# Patient Record
Sex: Female | Born: 1960
Health system: Southern US, Community
[De-identification: ages and names within clinical notes are randomized; demographics above are authoritative.]

## PROBLEM LIST (undated history)

## (undated) DIAGNOSIS — Z8639 Personal history of other endocrine, nutritional and metabolic disease: Secondary | ICD-10-CM

## (undated) DIAGNOSIS — Z8619 Personal history of other infectious and parasitic diseases: Secondary | ICD-10-CM

## (undated) DIAGNOSIS — R011 Cardiac murmur, unspecified: Secondary | ICD-10-CM

## (undated) HISTORY — DX: Cardiac murmur, unspecified: R01.1

## (undated) HISTORY — DX: Personal history of other endocrine, nutritional and metabolic disease: Z86.39

## (undated) HISTORY — PX: NASAL SEPTUM SURGERY: SHX37

## (undated) HISTORY — DX: Personal history of other infectious and parasitic diseases: Z86.19

## (undated) HISTORY — PX: OTHER SURGICAL HISTORY: SHX169

---

## 2014-12-13 LAB — HM COLONOSCOPY

## 2015-10-01 LAB — HM MAMMOGRAPHY: HM MAMMO: NORMAL (ref 0–4)

## 2015-10-24 LAB — HM PAP SMEAR

## 2016-09-10 ENCOUNTER — Encounter: Payer: Self-pay | Admitting: Family Medicine

## 2016-09-10 ENCOUNTER — Ambulatory Visit (INDEPENDENT_AMBULATORY_CARE_PROVIDER_SITE_OTHER): Payer: BLUE CROSS/BLUE SHIELD | Admitting: Family Medicine

## 2016-09-10 VITALS — BP 110/80 | HR 57 | Temp 98.0°F | Resp 16 | Ht 63.5 in | Wt 114.5 lb

## 2016-09-10 DIAGNOSIS — M47812 Spondylosis without myelopathy or radiculopathy, cervical region: Secondary | ICD-10-CM

## 2016-09-10 DIAGNOSIS — Z1231 Encounter for screening mammogram for malignant neoplasm of breast: Secondary | ICD-10-CM | POA: Diagnosis not present

## 2016-09-10 DIAGNOSIS — Z23 Encounter for immunization: Secondary | ICD-10-CM

## 2016-09-10 DIAGNOSIS — E538 Deficiency of other specified B group vitamins: Secondary | ICD-10-CM | POA: Diagnosis not present

## 2016-09-10 DIAGNOSIS — Z0000 Encounter for general adult medical examination without abnormal findings: Secondary | ICD-10-CM | POA: Diagnosis not present

## 2016-09-10 LAB — CBC WITH DIFFERENTIAL/PLATELET
BASOS PCT: 1 %
Basophils Absolute: 49 cells/uL (ref 0–200)
EOS PCT: 1 %
Eosinophils Absolute: 49 cells/uL (ref 15–500)
HCT: 38.3 % (ref 35.0–45.0)
Hemoglobin: 13.1 g/dL (ref 11.7–15.5)
LYMPHS PCT: 30 %
Lymphs Abs: 1470 cells/uL (ref 850–3900)
MCH: 32.3 pg (ref 27.0–33.0)
MCHC: 34.2 g/dL (ref 32.0–36.0)
MCV: 94.3 fL (ref 80.0–100.0)
MONOS PCT: 8 %
MPV: 9.8 fL (ref 7.5–12.5)
Monocytes Absolute: 392 cells/uL (ref 200–950)
Neutro Abs: 2940 cells/uL (ref 1500–7800)
Neutrophils Relative %: 60 %
PLATELETS: 288 10*3/uL (ref 140–400)
RBC: 4.06 MIL/uL (ref 3.80–5.10)
RDW: 12.3 % (ref 11.0–15.0)
WBC: 4.9 10*3/uL (ref 3.8–10.8)

## 2016-09-10 LAB — BASIC METABOLIC PANEL
BUN: 18 mg/dL (ref 7–25)
CALCIUM: 9.9 mg/dL (ref 8.6–10.4)
CO2: 28 mmol/L (ref 20–31)
CREATININE: 0.72 mg/dL (ref 0.50–1.05)
Chloride: 102 mmol/L (ref 98–110)
Glucose, Bld: 81 mg/dL (ref 65–99)
Potassium: 4.7 mmol/L (ref 3.5–5.3)
Sodium: 138 mmol/L (ref 135–146)

## 2016-09-10 LAB — LIPID PANEL
CHOLESTEROL: 237 mg/dL — AB (ref 125–200)
HDL: 119 mg/dL (ref >=46)
LDL CALC: 109 mg/dL (ref <130)
TRIGLYCERIDES: 45 mg/dL (ref <150)
Total CHOL/HDL Ratio: 2 Ratio (ref <=5.0)
VLDL: 9 mg/dL (ref <30)

## 2016-09-10 LAB — HEPATIC FUNCTION PANEL
ALBUMIN: 4.5 g/dL (ref 3.6–5.1)
ALT: 14 U/L (ref 6–29)
AST: 18 U/L (ref 10–35)
Alkaline Phosphatase: 46 U/L (ref 33–130)
BILIRUBIN DIRECT: 0.2 mg/dL (ref <=0.2)
Indirect Bilirubin: 1 mg/dL (ref 0.2–1.2)
Total Bilirubin: 1.2 mg/dL (ref 0.2–1.2)
Total Protein: 7.3 g/dL (ref 6.1–8.1)

## 2016-09-10 LAB — TSH: TSH: 1.09 mIU/L

## 2016-09-10 NOTE — Progress Notes (Signed)
Pre visit review using our clinic review tool, if applicable. No additional management support is needed unless otherwise documented below in the visit note. 

## 2016-09-10 NOTE — Progress Notes (Signed)
   Subjective:    Patient ID: Julia Ford, female    DOB: 11/06/1961, 55 y.o.   MRN: [id number]  HPI New to establish.  Recently moved from [place]  CPE- due for mammo in November, UTD on pap until 2019.  Colonoscopy 2015- due 2025.  Hx of B12 deficiency.  Pt exercises excessively- runs marathons and is triathlon training.  Known thyroid nodule- has been bx'd and results were negative.  Pt has known degenerative changes in C spine as seen on MRI- asking for PT referral.   Review of Systems Patient reports no vision/ hearing changes, adenopathy,fever, weight change,  persistant/recurrent hoarseness , swallowing issues, chest pain, palpitations, edema, persistant/recurrent cough, hemoptysis, dyspnea (rest/exertional/paroxysmal nocturnal), gastrointestinal bleeding (melena, rectal bleeding), abdominal pain, significant heartburn, bowel changes, GU symptoms (dysuria, hematuria, incontinence), Gyn symptoms (abnormal  bleeding, pain),  syncope, focal weakness, memory loss, numbness & tingling, skin/hair/nail changes, abnormal bruising or bleeding, anxiety, or depression.     Objective:   Physical Exam General Appearance:    Alert, cooperative, no distress, appears stated age  Head:    Normocephalic, without obvious abnormality, atraumatic  Eyes:    PERRL, conjunctiva/corneas clear, EOM's intact, fundi    benign, both eyes  Ears:    Normal TM's and external ear canals, both ears  Nose:   Nares normal, septum midline, mucosa normal, no drainage    or sinus tenderness  Throat:   Lips, mucosa, and tongue normal; teeth and gums normal  Neck:   Supple, symmetrical, trachea midline, no adenopathy;    Thyroid: no enlargement/tenderness/nodules  Back:     Symmetric, no curvature, ROM normal, no CVA tenderness  Lungs:     Clear to auscultation bilaterally, respirations unlabored  Chest Wall:    No tenderness or deformity   Heart:    Regular rate and rhythm, S1 and S2 normal, no murmur, rub   or gallop    Breast Exam:    Deferred to mammo  Abdomen:     Soft, non-tender, bowel sounds active all four quadrants,    no masses, no organomegaly  Genitalia:    Deferred  Rectal:    Extremities:   Extremities normal, atraumatic, no cyanosis or edema  Pulses:   2+ and symmetric all extremities  Skin:   Skin color, texture, turgor normal, no rashes or lesions  Lymph nodes:   Cervical, supraclavicular, and axillary nodes normal  Neurologic:   CNII-XII intact, normal strength, sensation and reflexes    throughout          Assessment & Plan:  PE- pt's PE WNL.  UTD on pap.  Due for mammo- order entered.  UTD on colonoscopy.  Check labs- including B12 as pt has hx of deficiency.  Check labs.  Anticipatory guidance provided.

## 2016-09-10 NOTE — Patient Instructions (Signed)
Follow up in 1 year or as needed We'll notify you of your lab results and make any changes if needed Keep up the good work on healthy diet and regular exercise- you look great!!! We'll call you with your mammogram appt and PT appts Call with any questions or concerns Welcome!  We're glad to have you!!!

## 2016-09-11 ENCOUNTER — Encounter: Payer: Self-pay | Admitting: Family Medicine

## 2016-09-11 ENCOUNTER — Ambulatory Visit: Payer: Self-pay | Admitting: Family Medicine

## 2016-09-11 LAB — VITAMIN B12: VITAMIN B 12: 328 pg/mL (ref 200–1100)

## 2016-09-11 LAB — VITAMIN D 25 HYDROXY (VIT D DEFICIENCY, FRACTURES): Vit D, 25-Hydroxy: 30 ng/mL (ref 30–100)

## 2016-09-14 ENCOUNTER — Ambulatory Visit (INDEPENDENT_AMBULATORY_CARE_PROVIDER_SITE_OTHER): Payer: BLUE CROSS/BLUE SHIELD | Admitting: Emergency Medicine

## 2016-09-14 DIAGNOSIS — E538 Deficiency of other specified B group vitamins: Secondary | ICD-10-CM | POA: Diagnosis not present

## 2016-09-14 MED ORDER — CYANOCOBALAMIN 1000 MCG/ML IJ SOLN
1000.0000 ug | Freq: Once | INTRAMUSCULAR | Status: AC
  Administered 2016-09-14: 1000 ug via INTRAMUSCULAR

## 2016-09-22 ENCOUNTER — Encounter: Payer: Self-pay | Admitting: General Practice

## 2016-10-07 ENCOUNTER — Encounter: Payer: Self-pay | Admitting: Family Medicine

## 2016-10-26 ENCOUNTER — Ambulatory Visit (INDEPENDENT_AMBULATORY_CARE_PROVIDER_SITE_OTHER): Payer: BLUE CROSS/BLUE SHIELD | Admitting: General Practice

## 2016-10-26 DIAGNOSIS — E538 Deficiency of other specified B group vitamins: Secondary | ICD-10-CM | POA: Diagnosis not present

## 2016-10-26 MED ORDER — CYANOCOBALAMIN 1000 MCG/ML IJ SOLN: 1000.0000 ug | Freq: Once | INTRAMUSCULAR | 0 refills | Status: DC

## 2016-10-26 MED ORDER — CYANOCOBALAMIN 1000 MCG/ML IJ SOLN
1000.0000 ug | Freq: Once | INTRAMUSCULAR | Status: DC
  Administered 2016-10-26: 1000 ug via INTRAMUSCULAR

## 2016-11-11 ENCOUNTER — Telehealth: Payer: Self-pay | Admitting: Family Medicine

## 2016-11-11 ENCOUNTER — Ambulatory Visit (INDEPENDENT_AMBULATORY_CARE_PROVIDER_SITE_OTHER): Payer: BLUE CROSS/BLUE SHIELD | Admitting: Family Medicine

## 2016-11-11 ENCOUNTER — Encounter: Payer: Self-pay | Admitting: Family Medicine

## 2016-11-11 VITALS — BP 106/62 | HR 52 | Temp 98.2°F | Resp 16 | Ht 64.0 in | Wt 117.0 lb

## 2016-11-11 DIAGNOSIS — R1013 Epigastric pain: Secondary | ICD-10-CM | POA: Diagnosis not present

## 2016-11-11 MED ORDER — GI COCKTAIL ~~LOC~~
30.0000 mL | Freq: Once | ORAL | Status: AC
  Administered 2016-11-11: 30 mL via ORAL

## 2016-11-11 MED ORDER — SUCRALFATE 1 G PO TABS: 1.0000 g | ORAL_TABLET | Freq: Three times a day (TID) | ORAL | 0 refills | Status: DC

## 2016-11-11 MED ORDER — OMEPRAZOLE 40 MG PO CPDR: 40.0000 mg | DELAYED_RELEASE_CAPSULE | Freq: Every day | ORAL | 3 refills | Status: DC

## 2016-11-11 NOTE — Patient Instructions (Signed)
Follow up as needed Start the Omeprazole daily x2 weeks to decrease acid production and then as needed Start the Carafate prior to meals and before bed to coat the stomach and prevent inflammation and allow healing (you will probably only need this for a few days but you have plenty!) Call with any questions or concerns Happy Holidays!!!

## 2016-11-11 NOTE — Telephone Encounter (Signed)
Julia Ford is calling pt to see if she can make the 11am appt, if not she can be placed on cody's schedule., per PCP.

## 2016-11-11 NOTE — Telephone Encounter (Signed)
Pt scheduled at 11 am today

## 2016-11-11 NOTE — Telephone Encounter (Signed)
Patient called back after speaking with triage to states she will not go to ED as instructed by nurse.  She states she will only see Dr. Beverely Lowabori.  Patient requesting to be called back in the event pcp has a cancellation today.

## 2016-11-11 NOTE — Telephone Encounter (Signed)
[facility] [place] [facility] - Cli TELEPHONE ADVICE RECORD [facility]  Patient Name: Julia SpittleCHRISTINE Newby  DOB: 05-10-1961    Initial Comment stomach cramps when she eats, pressure in chest, when she takes a deep breath she can feel pain in her stomach, when she lays down she can feel the chest pain more   Nurse Assessment  Nurse: Dorthula RuePatten, RN, Enrique SackKendra Date/Time (Eastern Time): 11/11/2016 10:14:44 AM  Confirm and document reason for call. If symptomatic, describe symptoms. ---Caller states she is having upper abdominal cramping. She states it's not really a "chest pain". She states it's an unusual pain she cannot describe it. She states she could not finish eating due to stomach cramping this morning. She states this started last night.  Does the patient have any new or worsening symptoms? ---Yes  Will a triage be completed? ---Yes  Related visit to physician within the last 2 weeks? ---No  Does the PT have any chronic conditions? (i.e. diabetes, asthma, etc.) ---No  Is the patient pregnant or possibly pregnant? (Ask all females between the ages of 1612-55) ---No  Is this a behavioral health or substance abuse call? ---No     Guidelines    Guideline Title Affirmed Question Affirmed Notes  Abdominal Pain - Upper [1] MILD-MODERATE pain AND [2] constant AND [3] present > 2 hours    Final Disposition User   See Physician within 4 Hours (or PCP triage) Dorthula RuePatten, RN, Kendra    Comments  Caller states she will not go to ED or Urgent Care despite her symptoms. She think it's just a virus. Advised her again of my recommendation. She states she is going to call office to put on cancellation list.   Referrals  GO TO FACILITY REFUSED   Disagree/Comply: Disagree  Disagree/Comply Reason: Disagree with instructions

## 2016-11-11 NOTE — Progress Notes (Signed)
   Subjective:    Patient ID: Julia SpittleChristine Trevino, female    DOB: 06/05/1961, 55 y.o.   MRN: [id number]  HPI abd pain- pt reports 'a really big cramp' in her stomach when she takes a deep breath.  Worse when she ate cereal this AM.  Was able to drink coffee after the cereal w/o difficulty.  Pt reports she will have chest 'tightness, like a cramp' when she lies down.  sxs started after eating dinner and worsened as the evening went on.  Was able to run 6 miles and go to the gym yesterday.  Husband had similar sxs that lasted 3 days last week.  No N/V/D.  No chest TTP   Review of Systems For ROS see HPI     Objective:   Physical Exam  Constitutional: She is oriented to person, place, and time. She appears well-developed and well-nourished. No distress.  HENT:  Head: Normocephalic and atraumatic.  Eyes: Conjunctivae and EOM are normal. Pupils are equal, round, and reactive to light.  Neck: Normal range of motion. Neck supple. No thyromegaly present.  Cardiovascular: Normal rate, regular rhythm, normal heart sounds and intact distal pulses.   No murmur heard. Pulmonary/Chest: Effort normal and breath sounds normal. No respiratory distress.  Abdominal: Soft. She exhibits no distension. There is tenderness (epigastric TTP). There is no rebound and no guarding.  Musculoskeletal: She exhibits no edema.  Lymphadenopathy:    She has no cervical adenopathy.  Neurological: She is alert and oriented to person, place, and time.  Skin: Skin is warm and dry.  Psychiatric: She has a normal mood and affect. Her behavior is normal.  Vitals reviewed.         Assessment & Plan:  Epigastric pain- new.  Pt's sxs and PE consistent w/ GERD/gastritis.  sxs improved s/p GI cocktail.  Start PPI, carafate.  Reviewed dietary and lifestyle modifications.  Will follow.

## 2016-11-11 NOTE — Progress Notes (Signed)
Pre visit review using our clinic review tool, if applicable. No additional management support is needed unless otherwise documented below in the visit note. 

## 2016-11-30 ENCOUNTER — Ambulatory Visit (INDEPENDENT_AMBULATORY_CARE_PROVIDER_SITE_OTHER): Payer: BLUE CROSS/BLUE SHIELD | Admitting: *Deleted

## 2016-11-30 DIAGNOSIS — E538 Deficiency of other specified B group vitamins: Secondary | ICD-10-CM | POA: Diagnosis not present

## 2016-11-30 MED ORDER — CYANOCOBALAMIN 1000 MCG/ML IJ SOLN
1000.0000 ug | Freq: Once | INTRAMUSCULAR | Status: AC
  Administered 2016-11-30: 1000 ug via INTRAMUSCULAR

## 2016-12-03 ENCOUNTER — Ambulatory Visit: Payer: BLUE CROSS/BLUE SHIELD

## 2017-01-01 ENCOUNTER — Ambulatory Visit (INDEPENDENT_AMBULATORY_CARE_PROVIDER_SITE_OTHER): Payer: BLUE CROSS/BLUE SHIELD | Admitting: *Deleted

## 2017-01-01 DIAGNOSIS — E538 Deficiency of other specified B group vitamins: Secondary | ICD-10-CM | POA: Diagnosis not present

## 2017-01-01 MED ORDER — CYANOCOBALAMIN 1000 MCG/ML IJ SOLN
1000.0000 ug | Freq: Once | INTRAMUSCULAR | Status: AC
  Administered 2017-01-01: 1000 ug via INTRAMUSCULAR

## 2017-01-07 ENCOUNTER — Ambulatory Visit: Payer: BLUE CROSS/BLUE SHIELD

## 2017-01-18 ENCOUNTER — Ambulatory Visit
Admission: RE | Admit: 2017-01-18 | Discharge: 2017-01-18 | Disposition: A | Discharge status: Home / Self Care | Payer: BLUE CROSS/BLUE SHIELD | Source: Ambulatory Visit | Attending: Family Medicine | Admitting: Family Medicine

## 2017-01-18 DIAGNOSIS — Z1231 Encounter for screening mammogram for malignant neoplasm of breast: Secondary | ICD-10-CM

## 2017-01-28 ENCOUNTER — Ambulatory Visit: Payer: Self-pay | Admitting: Family Medicine

## 2017-02-05 ENCOUNTER — Ambulatory Visit (INDEPENDENT_AMBULATORY_CARE_PROVIDER_SITE_OTHER): Payer: BLUE CROSS/BLUE SHIELD | Admitting: Physician Assistant

## 2017-02-05 ENCOUNTER — Encounter: Payer: Self-pay | Admitting: Physician Assistant

## 2017-02-05 ENCOUNTER — Other Ambulatory Visit: Payer: BLUE CROSS/BLUE SHIELD

## 2017-02-05 VITALS — BP 108/68 | HR 67 | Temp 97.7°F | Resp 14 | Ht 64.0 in | Wt 118.0 lb

## 2017-02-05 DIAGNOSIS — E538 Deficiency of other specified B group vitamins: Secondary | ICD-10-CM

## 2017-02-05 DIAGNOSIS — H6191 Disorder of right external ear, unspecified: Secondary | ICD-10-CM | POA: Diagnosis not present

## 2017-02-05 MED ORDER — MUPIROCIN CALCIUM 2 % EX CREA: TOPICAL_CREAM | CUTANEOUS | 0 refills | Status: DC

## 2017-02-05 MED ORDER — CYANOCOBALAMIN 1000 MCG/ML IJ SOLN
1000.0000 ug | Freq: Once | INTRAMUSCULAR | Status: AC
  Administered 2017-02-05: 1000 ug via INTRAMUSCULAR

## 2017-02-05 NOTE — Addendum Note (Signed)
Addended by: Con MemosMOORE, Maleigh Bagot S on: 02/05/2017 02:46 PM   Modules accepted: Orders

## 2017-02-05 NOTE — Addendum Note (Signed)
Addended by: Marcelline MatesMARTIN, Quetzal Meany on: 02/05/2017 02:30 PM   Modules accepted: Orders

## 2017-02-05 NOTE — Progress Notes (Signed)
   Pt presents to clinic today c/o right ear pain x 4 days. Pt states that right ear is painful with touch and pain is originating from the external ear. Describes pain as a 2.5/10 severity sharp pain upon palpation. Denies decreased hearing, tinnitus, drainage, dizziness, or ear pressure. Pt is a swimmer, and states she swims approx. 4 times a week. Denies having issues like this before. Denies recent travel. Has not tried anything OTC for pain, denies anything besides touch making it worse. Water from swimming this morning was not irritating.    Past Medical History:  Diagnosis Date  . Heart murmur   . History of chicken pox   . History of non anemic vitamin B12 deficiency     Current Outpatient Prescriptions on File Prior to Visit  Medication Sig Dispense Refill  . Misc Natural Products (OSTEO BI-FLEX JOINT SHIELD PO) Take by mouth.     No current facility-administered medications on file prior to visit.     Allergies  Allergen Reactions  . Codeine Other (See Comments)    Makes pt fall asleep (severe drowsiness), does not like to take    Family History  Problem Relation Age of Onset  . Heart attack Mother   . Hypertension Mother   . Heart attack Father   . Hypertension Father   . Cancer Maternal Grandmother     breast  . Stroke Maternal Grandfather   . Heart attack Paternal Grandfather     Social History   Social History  . Marital status: Married    Spouse name: N/A  . Number of children: N/A  . Years of education: N/A   Social History Main Topics  . Smoking status: Never Smoker  . Smokeless tobacco: Never Used  . Alcohol use Yes  . Drug use: No  . Sexual activity: Not Asked   Other Topics Concern  . None   Social History Narrative  . None   Review of Systems - See HPI.  All other ROS are negative.  BP 108/68   Pulse 67   Temp 97.7 F (36.5 C) (Oral)   Resp 14   Ht 5\' 4"  (1.626 m)   Wt 118 lb (53.5 kg)   SpO2 99%   BMI 20.25 kg/m   Physical  Exam  Constitutional: She is oriented to person, place, and time and well-developed, well-nourished, and in no distress.  HENT:  Head: Normocephalic and atraumatic.  Right Ear: Tympanic membrane normal.  Left Ear: External ear normal.  Ears:  Nose: Nose normal.  Mouth/Throat: Oropharynx is clear and moist. No oropharyngeal exudate.  Eyes: Conjunctivae are normal.  Neck: Neck supple.  Cardiovascular: Normal rate, regular rhythm, normal heart sounds and intact distal pulses.   Pulmonary/Chest: Effort normal and breath sounds normal. No respiratory distress. She has no wheezes. She has no rales. She exhibits no tenderness.  Lymphadenopathy:    She has no cervical adenopathy.  Neurological: She is alert and oriented to person, place, and time.  Skin: Skin is warm and dry. No rash noted.  Psychiatric: Affect normal.  Vitals reviewed.  Assessment/Plan: 1. Lesion of external ear canal, right Supportive measures reviewed. Hygiene discussed. Rx Mupirocin ointment to the area.  Follow-up if not resolving.    Piedad ClimesMartin, Harinder Romas Cody, PA-C

## 2017-02-05 NOTE — Progress Notes (Signed)
Pre visit review using our clinic review tool, if applicable. No additional management support is needed unless otherwise documented below in the visit note. 

## 2017-02-05 NOTE — Patient Instructions (Signed)
Please keep skin clean and dry.  Avoid scratching or picking at the ear so things can heal.  Apply the mupirocin as directed to the area with a q-tip. Do not put down into the ear.   Please let us know if symptoms are not resolving or if any new symptoms develop.

## 2017-04-02 ENCOUNTER — Ambulatory Visit (INDEPENDENT_AMBULATORY_CARE_PROVIDER_SITE_OTHER): Payer: BLUE CROSS/BLUE SHIELD | Admitting: Family Medicine

## 2017-04-02 ENCOUNTER — Encounter: Payer: Self-pay | Admitting: Family Medicine

## 2017-04-02 VITALS — BP 92/70 | HR 63 | Temp 98.1°F | Resp 16 | Ht 64.0 in | Wt 115.5 lb

## 2017-04-02 DIAGNOSIS — M542 Cervicalgia: Secondary | ICD-10-CM

## 2017-04-02 DIAGNOSIS — E538 Deficiency of other specified B group vitamins: Secondary | ICD-10-CM

## 2017-04-02 LAB — VITAMIN B12: Vitamin B-12: >1500 pg/mL — ABNORMAL HIGH (ref 211–911)

## 2017-04-02 MED ORDER — MELOXICAM 15 MG PO TABS: 15.0000 mg | ORAL_TABLET | Freq: Every day | ORAL | 1 refills | Status: DC

## 2017-04-02 MED ORDER — CYCLOBENZAPRINE HCL 5 MG PO TABS: 5.0000 mg | ORAL_TABLET | Freq: Every day | ORAL | 1 refills | Status: DC

## 2017-04-02 MED ORDER — CYANOCOBALAMIN 1000 MCG/ML IJ SOLN
1000.0000 ug | Freq: Once | INTRAMUSCULAR | Status: AC
  Administered 2017-04-02: 1000 ug via INTRAMUSCULAR

## 2017-04-02 NOTE — Patient Instructions (Signed)
Follow as scheduled Use the Flexeril at night to help w/ spasm- will cause drowsiness Take the Meloxicam once daily- w/ food- for inflammation.  Do not take any additional ibuprofen, Motrin, Aleve Heating pad as needed Try and vary your cycling position during your rides Call with any questions or concerns GOOD LUCK!!!

## 2017-04-02 NOTE — Progress Notes (Signed)
   Subjective:    Patient ID: Julia Ford, female    DOB: 1961/05/26, 56 y.o.   MRN: [id number]  HPI Neck pain- pt has hx of degenerative changes to cervical spine.  Pt has recently been riding her bike more w/ her triathlon training and afterwards, reports severe stiffness, decreased ROM.  Pt reports neck pain has improved somewhat w/ handlebar adjustments.  sxs started ~1 month ago.    B12 deficiency- pt is due for injxn and repeat labs.   Review of Systems For ROS see HPI     Objective:   Physical Exam  Constitutional: She is oriented to person, place, and time. She appears well-developed and well-nourished. No distress.  HENT:  Head: Normocephalic and atraumatic.  Musculoskeletal: She exhibits tenderness (bilateral trap TTP and spasm).  Full ROM of both shoulders and neck  Neurological: She is alert and oriented to person, place, and time. She has normal reflexes.  Skin: Skin is warm and dry.  Psychiatric: She has a normal mood and affect. Her behavior is normal. Thought content normal.  Vitals reviewed.         Assessment & Plan:  Neck pain- positional muscle spasm/inflammation from bike riding.  Reviewed dx, supportive care.  Start daily Mobic and Flexeril for pain and spasm.  Reviewed supportive care and red flags that should prompt return.  Pt expressed understanding and is in agreement w/ plan.   B12 deficiency- injxn given today.  Recheck labs.

## 2017-04-02 NOTE — Addendum Note (Signed)
Addended by: Geannie RisenBRODMERKEL, Jeanna Giuffre L on: 04/02/2017 09:13 AM   Modules accepted: Orders

## 2017-04-02 NOTE — Progress Notes (Signed)
Pre visit review using our clinic review tool, if applicable. No additional management support is needed unless otherwise documented below in the visit note. 

## 2017-04-06 ENCOUNTER — Encounter: Payer: Self-pay | Admitting: Family Medicine

## 2017-07-02 ENCOUNTER — Telehealth: Payer: Self-pay | Admitting: Family Medicine

## 2017-07-02 NOTE — Telephone Encounter (Signed)
Just FYI.
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
## 2017-07-02 NOTE — Telephone Encounter (Signed)
Patient is aware and stated verbal understanding 

## 2017-07-02 NOTE — Telephone Encounter (Signed)
Since pt doesn't want to go to ED- if her sxs worsen over the weekend [facility]

## 2017-09-16 ENCOUNTER — Encounter: Payer: Self-pay | Admitting: Family Medicine

## 2017-09-16 ENCOUNTER — Encounter: Payer: Self-pay | Admitting: General Practice

## 2017-09-16 ENCOUNTER — Ambulatory Visit (INDEPENDENT_AMBULATORY_CARE_PROVIDER_SITE_OTHER): Payer: BLUE CROSS/BLUE SHIELD | Admitting: Family Medicine

## 2017-09-16 VITALS — BP 96/73 | HR 63 | Temp 98.3°F | Resp 16 | Ht 64.0 in | Wt 113.1 lb

## 2017-09-16 DIAGNOSIS — Z0000 Encounter for general adult medical examination without abnormal findings: Secondary | ICD-10-CM | POA: Diagnosis not present

## 2017-09-16 DIAGNOSIS — E538 Deficiency of other specified B group vitamins: Secondary | ICD-10-CM

## 2017-09-16 DIAGNOSIS — Z23 Encounter for immunization: Secondary | ICD-10-CM

## 2017-09-16 DIAGNOSIS — E785 Hyperlipidemia, unspecified: Secondary | ICD-10-CM | POA: Diagnosis not present

## 2017-09-16 LAB — BASIC METABOLIC PANEL
BUN: 15 mg/dL (ref 6–23)
CALCIUM: 9.9 mg/dL (ref 8.4–10.5)
CHLORIDE: 103 meq/L (ref 96–112)
CO2: 32 meq/L (ref 19–32)
Creatinine, Ser: 0.69 mg/dL (ref 0.40–1.20)
GFR: 94.26 mL/min (ref >60.00)
Glucose, Bld: 94 mg/dL (ref 70–99)
Potassium: 5.2 mEq/L — ABNORMAL HIGH (ref 3.5–5.1)
SODIUM: 140 meq/L (ref 135–145)

## 2017-09-16 LAB — CBC WITH DIFFERENTIAL/PLATELET
BASOS ABS: 0.1 10*3/uL (ref 0.0–0.1)
BASOS PCT: 1.9 % (ref 0.0–3.0)
EOS ABS: 0.1 10*3/uL (ref 0.0–0.7)
Eosinophils Relative: 2.5 % (ref 0.0–5.0)
HEMATOCRIT: 39 % (ref 36.0–46.0)
HEMOGLOBIN: 13 g/dL (ref 12.0–15.0)
LYMPHS PCT: 36.9 % (ref 12.0–46.0)
Lymphs Abs: 1.2 10*3/uL (ref 0.7–4.0)
MCHC: 33.3 g/dL (ref 30.0–36.0)
MCV: 97.4 fl (ref 78.0–100.0)
Monocytes Absolute: 0.3 10*3/uL (ref 0.1–1.0)
Monocytes Relative: 8.1 % (ref 3.0–12.0)
Neutro Abs: 1.7 10*3/uL (ref 1.4–7.7)
Neutrophils Relative %: 50.6 % (ref 43.0–77.0)
Platelets: 253 10*3/uL (ref 150.0–400.0)
RBC: 4 Mil/uL (ref 3.87–5.11)
RDW: 12.1 % (ref 11.5–15.5)
WBC: 3.3 10*3/uL — ABNORMAL LOW (ref 4.0–10.5)

## 2017-09-16 LAB — HEPATIC FUNCTION PANEL
ALT: 27 U/L (ref 0–35)
AST: 22 U/L (ref 0–37)
Albumin: 4.4 g/dL (ref 3.5–5.2)
Alkaline Phosphatase: 65 U/L (ref 39–117)
BILIRUBIN DIRECT: 0.2 mg/dL (ref 0.0–0.3)
BILIRUBIN TOTAL: 1.3 mg/dL — AB (ref 0.2–1.2)
Total Protein: 7.1 g/dL (ref 6.0–8.3)

## 2017-09-16 LAB — B12 AND FOLATE PANEL: VITAMIN B 12: 283 pg/mL (ref 211–911)

## 2017-09-16 LAB — TSH: TSH: 0.85 u[IU]/mL (ref 0.35–4.50)

## 2017-09-16 LAB — LIPID PANEL
Cholesterol: 204 mg/dL — ABNORMAL HIGH (ref 0–200)
HDL: 84.6 mg/dL (ref >39.00)
LDL Cholesterol: 104 mg/dL — ABNORMAL HIGH (ref 0–99)
NONHDL: 119.17
Total CHOL/HDL Ratio: 2
Triglycerides: 77 mg/dL (ref 0.0–149.0)
VLDL: 15.4 mg/dL (ref 0.0–40.0)

## 2017-09-16 NOTE — Assessment & Plan Note (Signed)
Pt's PE WNL.  UTD on Tdap, GYN, and colonoscopy.  Flu shot given.  Check labs.  Anticipatory guidance provided.

## 2017-09-16 NOTE — Patient Instructions (Signed)
Follow up in 1 year or as needed We'll notify you of your lab results and make any changes if needed Keep up the good work- you look great!!! Try OTC Black Cohosh and/or [place] for hot flashes A low carb diet also improves hot flashes Call with any questions or concerns Happy Fall and CONGRATS!!!

## 2017-09-16 NOTE — Progress Notes (Signed)
   Subjective:    Patient ID: Julia Ford, female    DOB: 07-23-1961, 56 y.o.   MRN: [id number]  HPI CPE- UTD on mammo, pap, colonoscopy.  UTD on Tdap.  Due for flu.     Review of Systems Patient reports no vision/ hearing changes, adenopathy,fever, weight change,  persistant/recurrent hoarseness , swallowing issues, chest pain, palpitations, edema, persistant/recurrent cough, hemoptysis, dyspnea (rest/exertional/paroxysmal nocturnal), gastrointestinal bleeding (melena, rectal bleeding), abdominal pain, significant heartburn, bowel changes, GU symptoms (dysuria, hematuria, incontinence), Gyn symptoms (abnormal  bleeding, pain),  syncope, focal weakness, memory loss, numbness & tingling, skin/hair/nail changes, abnormal bruising or bleeding, anxiety, or depression.   + hot flashes    Objective:   Physical Exam General Appearance:    Alert, cooperative, no distress, appears stated age  Head:    Normocephalic, without obvious abnormality, atraumatic  Eyes:    PERRL, conjunctiva/corneas clear, EOM's intact, fundi    benign, both eyes  Ears:    Normal TM's and external ear canals, both ears  Nose:   Nares normal, septum midline, mucosa normal, no drainage    or sinus tenderness  Throat:   Lips, mucosa, and tongue normal; teeth and gums normal  Neck:   Supple, symmetrical, trachea midline, no adenopathy;    Thyroid: no enlargement/tenderness/nodules  Back:     Symmetric, no curvature, ROM normal, no CVA tenderness  Lungs:     Clear to auscultation bilaterally, respirations unlabored  Chest Wall:    No tenderness or deformity   Heart:    Regular rate and rhythm, S1 and S2 normal, no murmur, rub   or gallop  Breast Exam:    Deferred to mammo  Abdomen:     Soft, non-tender, bowel sounds active all four quadrants,    no masses, no organomegaly  Genitalia:    Deferred  Rectal:    Extremities:   Extremities normal, atraumatic, no cyanosis or edema  Pulses:   2+ and symmetric all  extremities  Skin:   Skin color, texture, turgor normal, no rashes or lesions  Lymph nodes:   Cervical, supraclavicular, and axillary nodes normal  Neurologic:   CNII-XII intact, normal strength, sensation and reflexes    throughout          Assessment & Plan:

## 2017-09-17 ENCOUNTER — Encounter: Payer: Self-pay | Admitting: Family Medicine

## 2017-09-20 ENCOUNTER — Ambulatory Visit (INDEPENDENT_AMBULATORY_CARE_PROVIDER_SITE_OTHER): Payer: BLUE CROSS/BLUE SHIELD | Admitting: *Deleted

## 2017-09-20 DIAGNOSIS — E538 Deficiency of other specified B group vitamins: Secondary | ICD-10-CM

## 2017-09-20 MED ORDER — CYANOCOBALAMIN 1000 MCG/ML IJ SOLN
1000.0000 ug | Freq: Once | INTRAMUSCULAR | Status: AC
  Administered 2017-09-20: 1000 ug via INTRAMUSCULAR

## 2017-09-26 ENCOUNTER — Encounter: Payer: Self-pay | Admitting: Family Medicine

## 2017-10-29 ENCOUNTER — Ambulatory Visit (INDEPENDENT_AMBULATORY_CARE_PROVIDER_SITE_OTHER): Payer: BLUE CROSS/BLUE SHIELD | Admitting: *Deleted

## 2017-10-29 DIAGNOSIS — E538 Deficiency of other specified B group vitamins: Secondary | ICD-10-CM | POA: Diagnosis not present

## 2017-10-29 MED ORDER — CYANOCOBALAMIN 1000 MCG/ML IJ SOLN
1000.0000 ug | Freq: Once | INTRAMUSCULAR | Status: AC
  Administered 2017-10-29: 1000 ug via INTRAMUSCULAR

## 2017-11-18 ENCOUNTER — Encounter: Payer: Self-pay | Admitting: Family Medicine

## 2017-11-18 ENCOUNTER — Ambulatory Visit (INDEPENDENT_AMBULATORY_CARE_PROVIDER_SITE_OTHER): Payer: BLUE CROSS/BLUE SHIELD | Admitting: Family Medicine

## 2017-11-18 VITALS — BP 100/62 | HR 65 | Temp 98.0°F | Ht 64.0 in | Wt 117.4 lb

## 2017-11-18 DIAGNOSIS — M25562 Pain in left knee: Secondary | ICD-10-CM

## 2017-11-18 DIAGNOSIS — M25551 Pain in right hip: Secondary | ICD-10-CM | POA: Diagnosis not present

## 2017-11-18 DIAGNOSIS — M6289 Other specified disorders of muscle: Secondary | ICD-10-CM | POA: Diagnosis not present

## 2017-11-18 DIAGNOSIS — H6982 Other specified disorders of Eustachian tube, left ear: Secondary | ICD-10-CM

## 2017-11-18 DIAGNOSIS — M26622 Arthralgia of left temporomandibular joint: Secondary | ICD-10-CM | POA: Diagnosis not present

## 2017-11-18 NOTE — Progress Notes (Signed)
Subjective  CC:  Chief Complaint  Patient presents with  . Bilateral ear pain    Left > Right  . Right Hip pain    x 4 months  . Left Knee pain    x 1 month    HPI: Julia Ford is a 56 y.o. female who presents to the office today to address the problems listed above in the chief complaint.  Left ear fullness x 2 days; had pain in front of ear yesterday. Swims. No pain now. Does grind teeth and wears a night guard. ? Increase in bruxism over holidays due to family stress. No jaw locking. No pain in temples or headaches. No sinus or allergy sxs.   Triathlete: c/o pain in right hip x 4-5 months, worse since last triathlon in October. Feels "tight". Was hurting after race so worked on stretching, stopped running x 3 weeks, and yoga but still feels tight and painful. No limp or trauma. Doesn't take any pain meds. Has continued yoga, bicycling and swimming.   Left knee pain - started recently. Thinks related to compensating due to painful right hip. No swelling, locking or give way. No h/o OA.   I reviewed the patients updated PMH, FH, and SocHx.    Patient Active Problem List   Diagnosis Date Noted  . Physical exam 09/16/2017  . B12 deficiency 09/10/2016  . Cervical spine degeneration 09/10/2016   No outpatient medications have been marked as taking for the 11/18/17 encounter (Office Visit) with Willow OraAndy, Sabriel Borromeo L, MD.    Allergies: Patient is allergic to codeine. Family History: Patient family history includes Cancer in her maternal grandmother; Heart attack in her father, mother, and paternal grandfather; Hypertension in her father and mother; Stroke in her maternal grandfather. Social History:  Patient  reports that  has never smoked. she has never used smokeless tobacco. She reports that she drinks alcohol. She reports that she does not use drugs.  Review of Systems: Constitutional: Negative for fever malaise or anorexia Cardiovascular: negative for chest pain Respiratory:  negative for SOB or persistent cough Gastrointestinal: negative for abdominal pain  Objective  Vitals: BP 100/62 (BP Location: Left Arm, Patient Position: Sitting, Cuff Size: Normal)   Pulse 65   Temp 98 F (36.7 C) (Oral)   Ht 5\' 4"  (1.626 m)   Wt 117 lb 6.1 oz (53.2 kg)   LMP 12/25/2015   SpO2 97%   BMI 20.15 kg/m  General: no acute distress , A&Ox3 HEENT: PEERL, conjunctiva normal, Oropharynx moist,neck is supple, nasal mucosa is normal. TMs and EACs normal bilaterally, + clicking TMJ w/o ttp, No LAD Cardiovascular:  RRR without murmur or gallop.  Respiratory:  Good breath sounds bilaterally, CTAB with normal respiratory effort Skin:  Warm, no rashes MSK: right hip with tender over quad femoris tendon; FROM but pain with full internal rotation. Left hip is normal. Nl quad strength but painful with full resistance. Nl knee exams bilaterally. Nl gait  Assessment  1. Eustachian tube dysfunction, left   2. TMJ tenderness, left   3. Right hip pain   4. Quadricep tightness   5. Acute pain of left knee      Plan   ETD:  Educated. No infection  TMJ: supportive care. advil if needed.  MSK: rec referral to sports medicine for xrays and evaluation. Rest - no running or bicycling until eval. Seems related to quad strain/tightness. advil and stretching.   Follow up: Return if symptoms worsen or fail to improve.  Commons side effects, risks, benefits, and alternatives for medications and treatment plan prescribed today were discussed, and the patient expressed understanding of the given instructions. Patient is instructed to call or message via MyChart if he/she has any questions or concerns regarding our treatment plan. No barriers to understanding were identified. We discussed Red Flag symptoms and signs in detail. Patient expressed understanding regarding what to do in case of urgent or emergency type symptoms.   Medication list was reconciled, printed and provided to the patient  in AVS. Patient instructions and summary information was reviewed with the patient as documented in the AVS. This note was prepared with assistance of Dragon voice recognition software. Occasional wrong-word or sound-a-like substitutions may have occurred due to the inherent limitations of voice recognition software  Orders Placed This Encounter  Procedures  . Ambulatory referral to Sports Medicine   No orders of the defined types were placed in this encounter.

## 2017-11-18 NOTE — Patient Instructions (Signed)
We will call you with information regarding your referral appointment. Sports Medicine You can have xrays there when you go.   Stretch your quads; stop bicycling.

## 2017-11-20 ENCOUNTER — Encounter: Payer: Self-pay | Admitting: Family Medicine

## 2017-11-29 ENCOUNTER — Ambulatory Visit
Admission: RE | Admit: 2017-11-29 | Discharge: 2017-11-29 | Disposition: A | Discharge status: Home / Self Care | Payer: BLUE CROSS/BLUE SHIELD | Source: Ambulatory Visit | Attending: Sports Medicine | Admitting: Sports Medicine

## 2017-11-29 ENCOUNTER — Ambulatory Visit (INDEPENDENT_AMBULATORY_CARE_PROVIDER_SITE_OTHER): Payer: BLUE CROSS/BLUE SHIELD | Admitting: Sports Medicine

## 2017-11-29 ENCOUNTER — Encounter: Payer: Self-pay | Admitting: Sports Medicine

## 2017-11-29 VITALS — BP 113/55 | Ht 64.0 in | Wt 118.0 lb

## 2017-11-29 DIAGNOSIS — M25551 Pain in right hip: Secondary | ICD-10-CM

## 2017-11-29 NOTE — Patient Instructions (Signed)
Julia Ford [phone]

## 2017-11-30 ENCOUNTER — Encounter: Payer: Self-pay | Admitting: Sports Medicine

## 2017-11-30 NOTE — Progress Notes (Signed)
   Subjective:    Patient ID: Julia SpittleChristine Mable, female    DOB: Jul 25, 1961, 57 y.o.   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 She denies any trauma but her pain began when training for a marathon. After her race she took some time off and got some physical therapy. However, her pain never really resolved. She's been unable to return to running. She does not have any pain while swimming. She does endorse some pain with cycling, especially when faced with increasing resistance. She localizes her pain deep within the right groin. She describes a feeling of catching as well as a feeling that the hip wants to pop. She denies any low back pain. She does get some radiating pain down her thigh. She has not noticed any swelling. Some pain at rest. She's not had any imaging. No prior hip surgeries. No numbness or tingling.  She's also complaining of some left knee pain which has been present for about a month. No trauma. Pain is primarily along the medial knee. No swelling. No locking. No catching or popping. No feelings of instability. No pain with walking. No problems with her knee in the past. No prior knee surgeries.  Past medical history reviewed Medications reviewed Allergies reviewed    Review of Systems As above    Objective:   Physical Exam  Well-developed, fit appearing. No acute distress. Awake alert and oriented 3. Vital signs reviewed  Right hip: Patient has smooth painless hip range of motion with a negative logroll. Negative FABER but an equivocal FADIR. No tenderness to palpation over the greater trochanteric bursa. No tenderness over the SI joint. No pain with resisted hip flexion. Negative hop test. Neurovascularly intact distally.  Left knee: Full range of motion. No effusion. Good strength. She is tender to palpation along the medial joint line but a negative McMurray's.  Negative Thessaly's. Knee is stable to valgus and varus stressing. Negative anterior drawer, negative posterior drawer. Neurovascularly intact distally.  X-rays of the right hip as well as an x-ray of the pelvis shows some mild degenerative changes of the right hip but otherwise unremarkable. No obvious stress fracture.      Assessment & Plan:   Chronic right hip pain-rule out labral tear versus chronic stress reaction Left knee pain likely secondary to compensation from chronic right hip pain  X-rays are fairly unremarkable other than some mild degenerative changes of the right hip. Were going to get an MRI arthrogram specifically to rule out a labral tear of the hip. This will also rule out any sort of stress reaction that she may have in the femoral neck. Phone follow-up with those results when available. In the meantime, patient will avoid running but can continue swimming and cycling as long as there is minimal pain. We will delineate a more definitive treatment plan based on her MRI findings. In regards to her left knee pain, were going to take a watchful waiting approach for now. If symptoms do not improve, consider imaging.

## 2017-12-01 ENCOUNTER — Ambulatory Visit (INDEPENDENT_AMBULATORY_CARE_PROVIDER_SITE_OTHER): Payer: BLUE CROSS/BLUE SHIELD | Admitting: *Deleted

## 2017-12-01 DIAGNOSIS — E538 Deficiency of other specified B group vitamins: Secondary | ICD-10-CM | POA: Diagnosis not present

## 2017-12-01 MED ORDER — CYANOCOBALAMIN 1000 MCG/ML IJ SOLN
1000.0000 ug | Freq: Once | INTRAMUSCULAR | Status: AC
  Administered 2017-12-01: 1000 ug via INTRAMUSCULAR

## 2017-12-01 NOTE — Progress Notes (Signed)
Patient in for b12 injection.  Due to get injections monthly through march 2019.    Pt. Tolerated well and will return in one month.

## 2017-12-16 ENCOUNTER — Ambulatory Visit
Admission: RE | Admit: 2017-12-16 | Discharge: 2017-12-16 | Disposition: A | Discharge status: Home / Self Care | Payer: BLUE CROSS/BLUE SHIELD | Source: Ambulatory Visit | Attending: Sports Medicine | Admitting: Sports Medicine

## 2017-12-16 DIAGNOSIS — M25551 Pain in right hip: Secondary | ICD-10-CM

## 2017-12-16 MED ORDER — IOPAMIDOL (ISOVUE-M 200) INJECTION 41%
15.0000 mL | Freq: Once | INTRAMUSCULAR | Status: AC
  Administered 2017-12-16: 15 mL via INTRA_ARTICULAR

## 2017-12-20 ENCOUNTER — Telehealth: Payer: Self-pay | Admitting: Sports Medicine

## 2017-12-20 NOTE — Telephone Encounter (Signed)
  I spoke with the patient on the phone today after reviewing the MRI of her right hip. Patient has an irregular tear of the anterior superior lateral aspect of the labrum with underlying osteoarthritic changes in the acetabulum. Based on these findings I recommended consultation with Dr. Magnus IvanBlackman to discuss further treatment. She may wish to try a cortisone injection before proceeding with arthroscopy. I will defer further workup and treatment to the discretion of Dr. Magnus IvanBlackman and the patient will follow-up with me as needed.

## 2017-12-21 ENCOUNTER — Other Ambulatory Visit: Payer: Self-pay

## 2017-12-21 DIAGNOSIS — M25551 Pain in right hip: Secondary | ICD-10-CM

## 2017-12-27 ENCOUNTER — Encounter (INDEPENDENT_AMBULATORY_CARE_PROVIDER_SITE_OTHER): Payer: Self-pay | Admitting: Orthopaedic Surgery

## 2017-12-27 ENCOUNTER — Ambulatory Visit (INDEPENDENT_AMBULATORY_CARE_PROVIDER_SITE_OTHER): Payer: BLUE CROSS/BLUE SHIELD | Admitting: Orthopaedic Surgery

## 2017-12-27 DIAGNOSIS — M25551 Pain in right hip: Secondary | ICD-10-CM | POA: Insufficient documentation

## 2017-12-27 DIAGNOSIS — M1611 Unilateral primary osteoarthritis, right hip: Secondary | ICD-10-CM | POA: Insufficient documentation

## 2017-12-27 NOTE — Progress Notes (Signed)
Office Visit Note   Patient: Julia SpittleChristine Ford           Date of Birth: 1961/03/17           MRN: [id number] Visit Date: 12/27/2017              Requested by: Sheliah Hatchabori, Katherine E, MD [address] [place] [id number] PCP: Sheliah Hatchabori, Katherine E, MD   Assessment & Plan: Visit Diagnoses:  1. Pain of right hip joint   2. Unilateral primary osteoarthritis, right hip     Plan: We taTlked a long time and in detail about her hip.  I do feel the high impact aerobic activity is contributing to the pain that she is having.  I would not recommend an arthroscopic intervention due to the hip difficulty attempting to get a mild come to the labrum.  He understands that continued high impact aerobic activities may slowly worsen arthritic changes in the hip.  She also should try a medication such as Tumeric.  I let her know that we can always try an intra-articular steroid injection right hip at that bothers her enough.  All questions concerns were answered and addressed.  She will follow-up otherwise as needed.  This gave her some reassurance that she is not having to a hip replacement just yet.  Follow-Up Instructions: Return if symptoms worsen or fail to improve.   Orders:  No orders of the defined types were placed in this encounter.  No orders of the defined types were placed in this encounter.     Procedures: No procedures performed   Clinical Data: No additional findings.   Subjective: Chief Complaint  Patient presents with  . Right Hip - Follow-up  Patient is a very pleasant 57 year old I am seeing for the first time for her right hip.  She is an avid triathlete and is taken off running since December and wants to get back to  her running but she is having a lot of pain in her groin.  She saw Dr. Margaretha Sheffieldraper who obtained plain films and then eventually an MRI and she is here to go over the findings of this since it did show some arthritic changes in her right hip.  She is now  pain-free since she is taken off of running.  She says the groin pain is slowly worsened over a year with high impact training for triathlon.  She does have a race in March she wants to participate in.  HPI  Review of Systems She is a petite individual.  She denies any headache, chest pain, shortness of breath, fever, chills, vomiting, nausea  Objective: Vital Signs: LMP 12/25/2015   Physical Exam She is alert and oriented x3 and in no acute distress.  She does not walk with a limp.  She is very petite. Ortho Exam Examination of her both her hips today is basically normal.  She has no significant pain on extremes of internal or external rotation of either hip. Specialty Comments:  No specialty comments available.  Imaging: No results found. I was able to independently review the plain films and MRI of her right hip which also to the left hip on both side.  She does have some mild arthritic changes on the right side with some degenerative tearing of the labrum and cystic changes in the acetabulum and a small area of full-thickness loss in the acetabulum.  It is only a small area.  There is not a significant joint  effusion at all.  The femoral head appears normal.  This is on the right side the muscles and tendons around the hip appeared normal.  Her left hip does show some mild edema in the roof of the acetabulum and cystic changes there which are minimal.  PMFS History: Patient Active Problem List   Diagnosis Date Noted  . Unilateral primary osteoarthritis, right hip 12/27/2017  . Pain of right hip joint 12/27/2017  . Physical exam 09/16/2017  . B12 deficiency 09/10/2016  . Cervical spine degeneration 09/10/2016   Past Medical History:  Diagnosis Date  . Heart murmur   . History of chicken pox   . History of non anemic vitamin B12 deficiency     Family History  Problem Relation Age of Onset  . Heart attack Mother   . Hypertension Mother   . Heart attack Father   .  Hypertension Father   . Cancer Maternal Grandmother        breast  . Stroke Maternal Grandfather   . Heart attack Paternal Grandfather     Past Surgical History:  Procedure Laterality Date  . dental implants    . NASAL SEPTUM SURGERY     Social History   Occupational History  . Not on file  Tobacco Use  . Smoking status: Never Smoker  . Smokeless tobacco: Never Used  Substance and Sexual Activity  . Alcohol use: Yes  . Drug use: No  . Sexual activity: Not on file

## 2018-02-09 ENCOUNTER — Ambulatory Visit (INDEPENDENT_AMBULATORY_CARE_PROVIDER_SITE_OTHER): Payer: BLUE CROSS/BLUE SHIELD | Admitting: Emergency Medicine

## 2018-02-09 DIAGNOSIS — E538 Deficiency of other specified B group vitamins: Secondary | ICD-10-CM

## 2018-02-09 MED ORDER — CYANOCOBALAMIN 1000 MCG/ML IJ SOLN
1000.0000 ug | Freq: Once | INTRAMUSCULAR | Status: AC
  Administered 2018-02-09: 1000 ug via INTRAMUSCULAR

## 2018-02-09 NOTE — Progress Notes (Signed)
Patient presents today for B12 injection.  Pt tolerated well

## 2018-03-07 IMAGING — XA DG FLUORO GUIDE NDL PLC/BX
3 series · 3 of 3 positions shown · non-contrast
Comparison: none

CLINICAL DATA: Right hip pain with running.

[Series 1: ortho standard · 1 of 1 slices shown (1 of 3)]
[im 1/1]
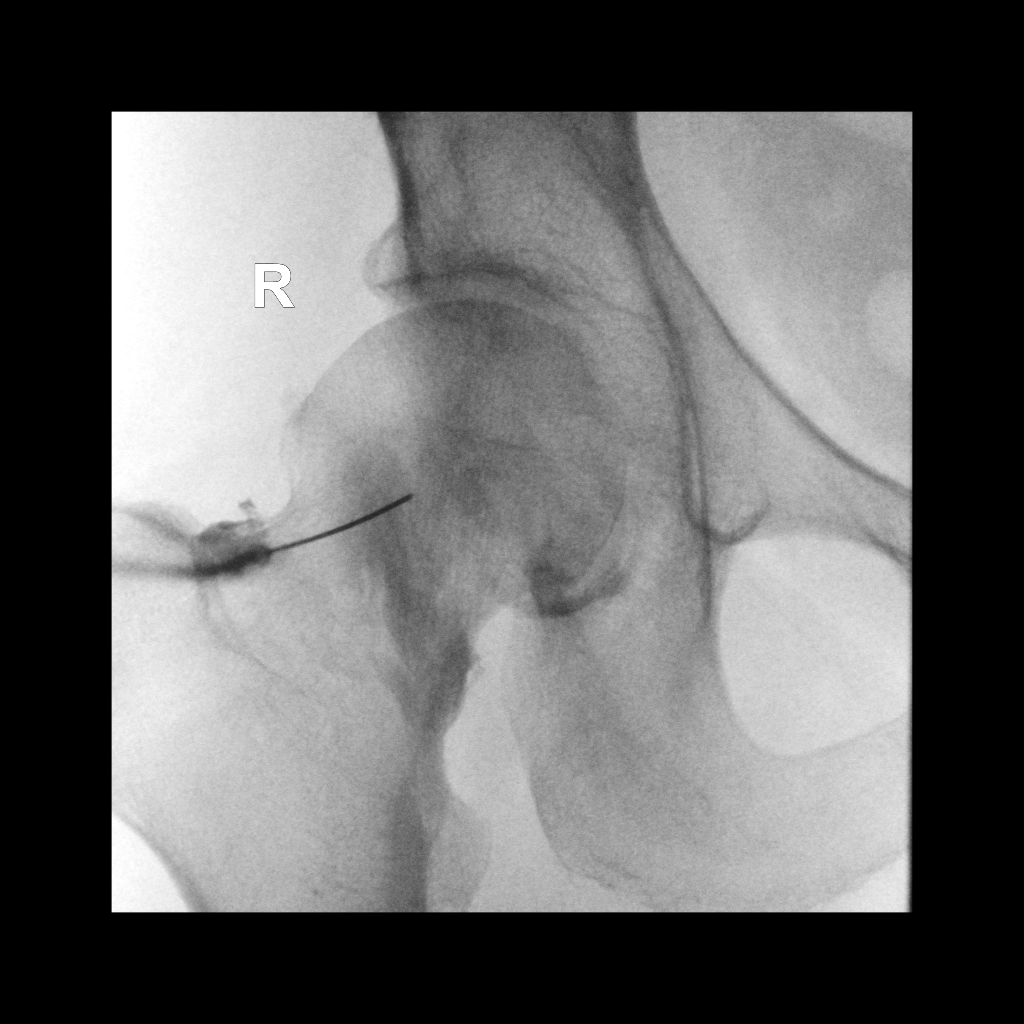

[Series 2: ortho standard · 1 of 1 slices shown (2 of 3)]
[im 1/1]
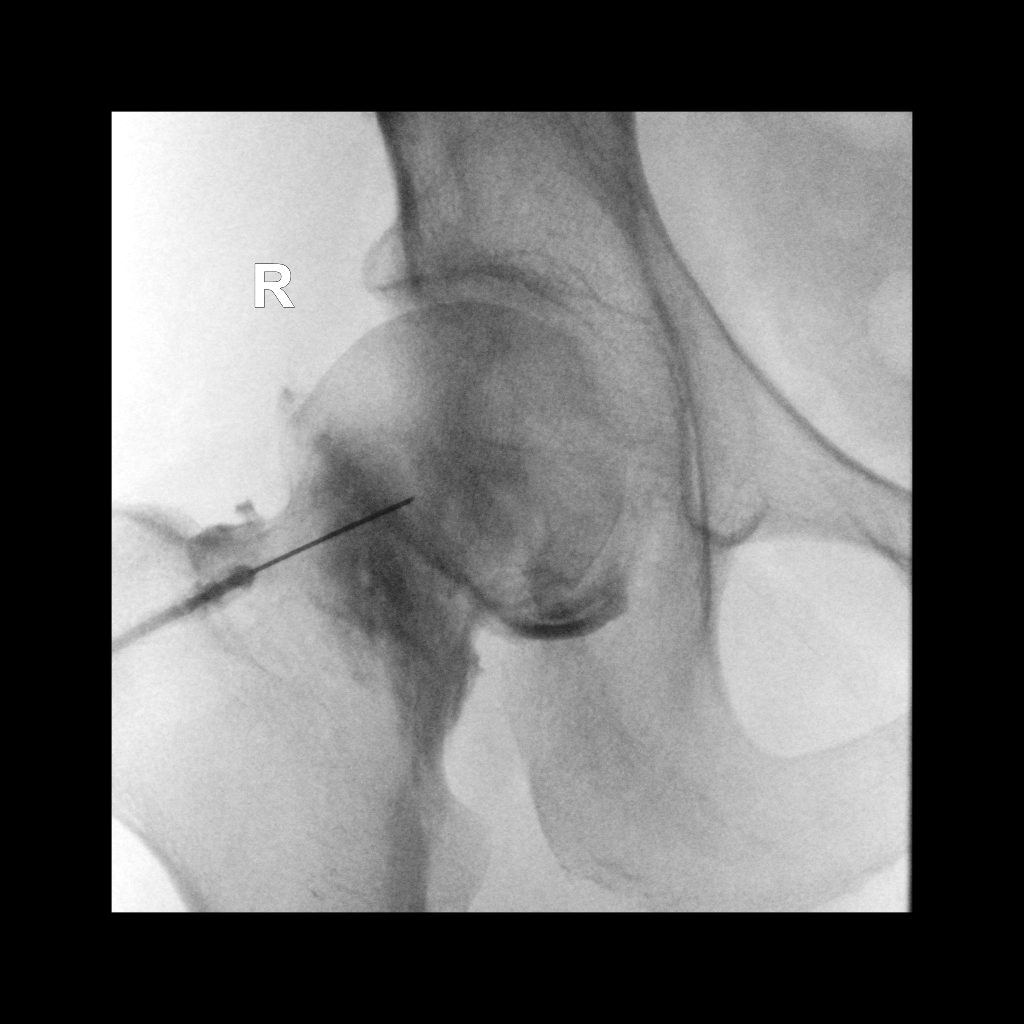

[Series 3: ortho standard · 1 of 1 slices shown (3 of 3)]
[im 1/1]
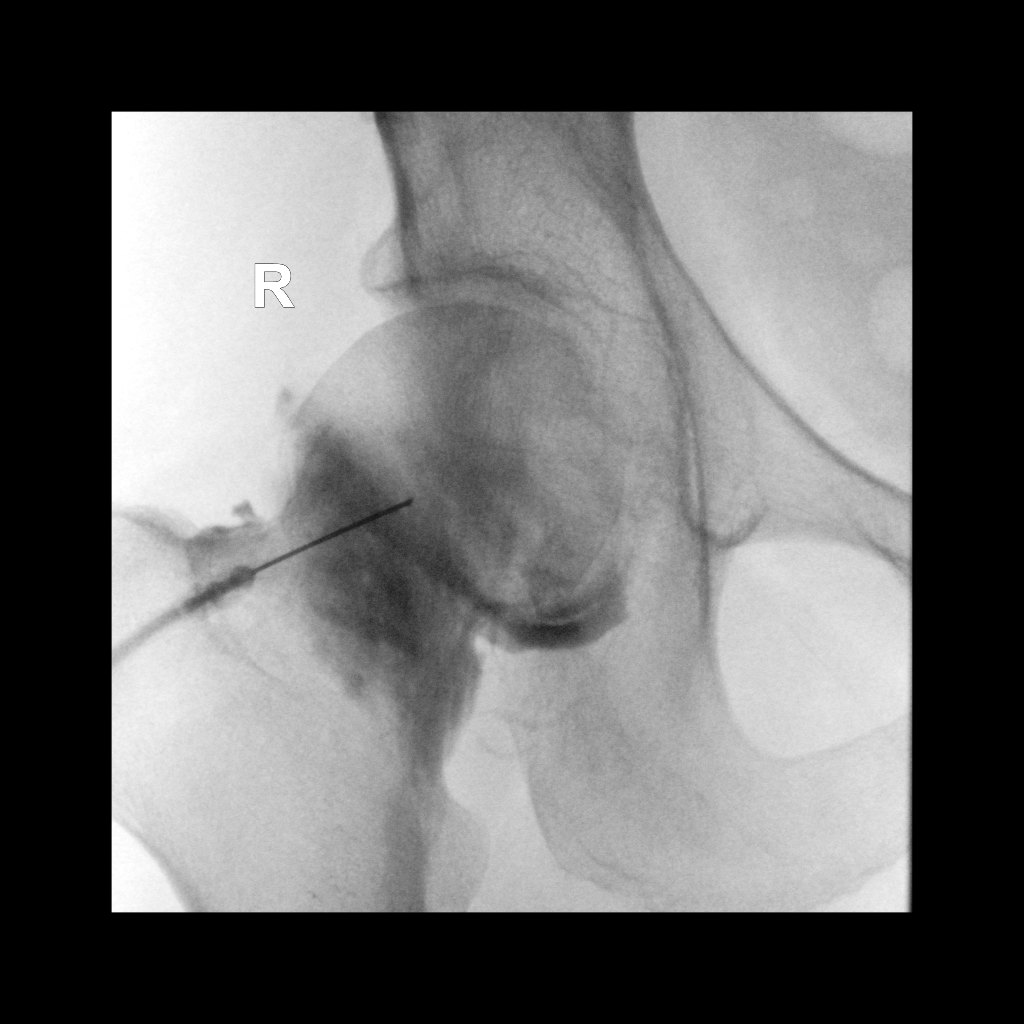

[3 of 3 positions shown; findings below may reference images not displayed]

FLUOROSCOPY TIME:  Radiation Exposure Index (as provided by the
fluoroscopic device): 11.54 uGy*m2

Fluoroscopy Time:  9 seconds

Number of Acquired Images:  0

PROCEDURE:
RIGHT/LEFT HIP INJECTION UNDER FLUOROSCOPY

An appropriate skin entrance site was determined. The site was
marked, prepped with Betadine, draped in the usual sterile fashion,
and infiltrated locally with 1% Lidocaine. A 22 gauge spinal needle
was advanced to the superolateral margin of the femoral head under
intermittent fluoroscopy. 1 ml of Lidocaine injected easily. A
mixture of 0.1 ml Multihance in 20 ml of dilute Isovue-M 200 was
then used to opacify the right hip joint. No immediate complication.
IMPRESSION: Technically successful right hip injection for MRI.

## 2018-03-21 ENCOUNTER — Ambulatory Visit (INDEPENDENT_AMBULATORY_CARE_PROVIDER_SITE_OTHER): Payer: BLUE CROSS/BLUE SHIELD | Admitting: *Deleted

## 2018-03-21 DIAGNOSIS — E538 Deficiency of other specified B group vitamins: Secondary | ICD-10-CM | POA: Diagnosis not present

## 2018-03-21 MED ORDER — CYANOCOBALAMIN 1000 MCG/ML IJ SOLN
1000.0000 ug | Freq: Once | INTRAMUSCULAR | Status: AC
  Administered 2018-03-21: 1000 ug via INTRAMUSCULAR

## 2018-03-21 NOTE — Progress Notes (Signed)
Pt came in today for a 4 of 6 series of B12 Injections.

## 2018-03-24 NOTE — Addendum Note (Signed)
Addended by: Lenis Dickinson on: 03/24/2018 09:48 AM   Modules accepted: Orders

## 2018-04-25 ENCOUNTER — Other Ambulatory Visit: Payer: Self-pay | Admitting: Family Medicine

## 2018-04-25 ENCOUNTER — Encounter: Payer: Self-pay | Admitting: Family Medicine

## 2018-04-25 DIAGNOSIS — Z1231 Encounter for screening mammogram for malignant neoplasm of breast: Secondary | ICD-10-CM

## 2018-04-26 ENCOUNTER — Ambulatory Visit
Admission: RE | Admit: 2018-04-26 | Discharge: 2018-04-26 | Disposition: A | Discharge status: Home / Self Care | Payer: BLUE CROSS/BLUE SHIELD | Source: Ambulatory Visit | Attending: Family Medicine | Admitting: Family Medicine

## 2018-04-26 DIAGNOSIS — Z1231 Encounter for screening mammogram for malignant neoplasm of breast: Secondary | ICD-10-CM

## 2018-05-16 ENCOUNTER — Other Ambulatory Visit: Payer: Self-pay

## 2018-05-16 ENCOUNTER — Other Ambulatory Visit (HOSPITAL_COMMUNITY)
Admission: RE | Admit: 2018-05-16 | Discharge: 2018-05-16 | Disposition: A | Discharge status: Home / Self Care | Payer: BLUE CROSS/BLUE SHIELD | Source: Ambulatory Visit | Attending: Family Medicine | Admitting: Family Medicine

## 2018-05-16 ENCOUNTER — Ambulatory Visit (INDEPENDENT_AMBULATORY_CARE_PROVIDER_SITE_OTHER): Payer: BLUE CROSS/BLUE SHIELD | Admitting: Family Medicine

## 2018-05-16 ENCOUNTER — Encounter: Payer: Self-pay | Admitting: Family Medicine

## 2018-05-16 VITALS — BP 112/56 | HR 66 | Temp 98.1°F | Resp 16 | Ht 64.0 in | Wt 115.5 lb

## 2018-05-16 DIAGNOSIS — E785 Hyperlipidemia, unspecified: Secondary | ICD-10-CM | POA: Diagnosis not present

## 2018-05-16 DIAGNOSIS — E538 Deficiency of other specified B group vitamins: Secondary | ICD-10-CM | POA: Diagnosis not present

## 2018-05-16 DIAGNOSIS — Z124 Encounter for screening for malignant neoplasm of cervix: Secondary | ICD-10-CM | POA: Diagnosis not present

## 2018-05-16 MED ORDER — CYANOCOBALAMIN 1000 MCG/ML IJ SOLN
1000.0000 ug | Freq: Once | INTRAMUSCULAR | Status: AC
  Administered 2018-05-16: 1000 ug via INTRAMUSCULAR

## 2018-05-16 NOTE — Addendum Note (Signed)
Addended by: Lenis DickinsonILLARD, Derold Dorsch M on: 05/16/2018 04:01 PM   Modules accepted: Orders

## 2018-05-16 NOTE — Patient Instructions (Signed)
Follow up in 1 year or as needed We'll notify you of your lab results and make any changes if needed Keep up the good work!  You look great! Call with any questions or concerns GOOD LUCK WITH THE MOVE!

## 2018-05-16 NOTE — Assessment & Plan Note (Signed)
Pap collected. 

## 2018-05-16 NOTE — Assessment & Plan Note (Signed)
Pt to get B12 shot today after lab draw.

## 2018-05-16 NOTE — Progress Notes (Signed)
   Subjective:    Patient ID: Julia Ford, female    DOB: [birth year], 57 y.o.   MRN: [id number]  HPI Hyperlipidemia- chronic problem, very mildly elevated.  Controlling w/ diet and exercise.  Denies CP, SOB, HAs, visual changes, abd pain, N/V, myalgias  B12 deficiency- pt has hx of this and gets regular B12 shots.  Due for repeat labs.  Denies fatigue, numbness/tingling.  Due for shot today.  Pap- pt is UTD on mammo, due for pap.  No vaginal concerns- no d/c, bleeding, or pain.   Review of Systems For ROS see HPI     Objective:   Physical Exam  Constitutional: She is oriented to person, place, and time. She appears well-developed and well-nourished. No distress.  HENT:  Head: Normocephalic and atraumatic.  Neck: Normal range of motion. Neck supple. No thyromegaly present.  Cardiovascular: Normal rate, regular rhythm, normal heart sounds and intact distal pulses. Exam reveals no friction rub.  No murmur heard. Pulmonary/Chest: Effort normal and breath sounds normal. No stridor. No respiratory distress. She has no wheezes. She has no rales.  Abdominal: Soft. Bowel sounds are normal. She exhibits no distension and no mass. There is no tenderness. There is no rebound and no guarding.  Genitourinary: Vagina normal and uterus normal. Rectal exam shows no external hemorrhoid and no fissure. There is no rash, tenderness, lesion or injury on the right labia. There is no rash, tenderness, lesion or injury on the left labia. Uterus is not deviated, not enlarged, not fixed and not tender. Cervix exhibits no motion tenderness, no discharge and no friability. Right adnexum displays no mass, no tenderness and no fullness. Left adnexum displays no mass, no tenderness and no fullness. No erythema, tenderness or bleeding in the vagina. No foreign body in the vagina. No signs of injury around the vagina. No vaginal discharge found.  Lymphadenopathy:    She has no cervical adenopathy.  Neurological: She  is alert and oriented to person, place, and time.  Skin: Skin is warm and dry.  Psychiatric: She has a normal mood and affect. Her behavior is normal. Thought content normal.  Vitals reviewed.         Assessment & Plan:

## 2018-05-17 ENCOUNTER — Encounter: Payer: Self-pay | Admitting: General Practice

## 2018-05-17 LAB — CBC WITH DIFFERENTIAL/PLATELET
BASOS PCT: 0.8 % (ref 0.0–3.0)
Basophils Absolute: 0.1 10*3/uL (ref 0.0–0.1)
EOS ABS: 0.1 10*3/uL (ref 0.0–0.7)
EOS PCT: 1.3 % (ref 0.0–5.0)
HCT: 39.2 % (ref 36.0–46.0)
HEMOGLOBIN: 13.4 g/dL (ref 12.0–15.0)
LYMPHS ABS: 1.3 10*3/uL (ref 0.7–4.0)
Lymphocytes Relative: 20 % (ref 12.0–46.0)
MCHC: 34.2 g/dL (ref 30.0–36.0)
MCV: 95.2 fl (ref 78.0–100.0)
MONO ABS: 0.3 10*3/uL (ref 0.1–1.0)
Monocytes Relative: 4.9 % (ref 3.0–12.0)
NEUTROS PCT: 73 % (ref 43.0–77.0)
Neutro Abs: 4.8 10*3/uL (ref 1.4–7.7)
Platelets: 257 10*3/uL (ref 150.0–400.0)
RBC: 4.12 Mil/uL (ref 3.87–5.11)
RDW: 12.2 % (ref 11.5–15.5)
WBC: 6.6 10*3/uL (ref 4.0–10.5)

## 2018-05-17 LAB — BASIC METABOLIC PANEL
BUN: 20 mg/dL (ref 6–23)
CO2: 27 meq/L (ref 19–32)
CREATININE: 0.68 mg/dL (ref 0.40–1.20)
Calcium: 10 mg/dL (ref 8.4–10.5)
Chloride: 100 mEq/L (ref 96–112)
GFR: 95.62 mL/min (ref >60.00)
Glucose, Bld: 102 mg/dL — ABNORMAL HIGH (ref 70–99)
POTASSIUM: 4.4 meq/L (ref 3.5–5.1)
Sodium: 137 mEq/L (ref 135–145)

## 2018-05-17 LAB — LIPID PANEL
CHOLESTEROL: 256 mg/dL — AB (ref 0–200)
HDL: 103 mg/dL (ref >39.00)
LDL Cholesterol: 133 mg/dL — ABNORMAL HIGH (ref 0–99)
NonHDL: 153.34
TRIGLYCERIDES: 103 mg/dL (ref 0.0–149.0)
Total CHOL/HDL Ratio: 2
VLDL: 20.6 mg/dL (ref 0.0–40.0)

## 2018-05-17 LAB — TSH: TSH: 0.93 u[IU]/mL (ref 0.35–4.50)

## 2018-05-17 LAB — HEPATIC FUNCTION PANEL
ALBUMIN: 4.7 g/dL (ref 3.5–5.2)
ALK PHOS: 63 U/L (ref 39–117)
ALT: 20 U/L (ref 0–35)
AST: 22 U/L (ref 0–37)
Bilirubin, Direct: 0.2 mg/dL (ref 0.0–0.3)
Total Bilirubin: 1.3 mg/dL — ABNORMAL HIGH (ref 0.2–1.2)
Total Protein: 7.5 g/dL (ref 6.0–8.3)

## 2018-05-17 LAB — VITAMIN B12: Vitamin B-12: 268 pg/mL (ref 211–911)

## 2018-05-18 LAB — CYTOLOGY - PAP
Diagnosis: NEGATIVE
HPV: NOT DETECTED

## 2018-05-19 ENCOUNTER — Encounter: Payer: Self-pay | Admitting: General Practice

## 2018-05-20 ENCOUNTER — Ambulatory Visit: Payer: BLUE CROSS/BLUE SHIELD | Admitting: Family Medicine

## 2018-05-23 ENCOUNTER — Ambulatory Visit: Payer: BLUE CROSS/BLUE SHIELD | Admitting: Family Medicine

## 2018-06-23 ENCOUNTER — Ambulatory Visit (INDEPENDENT_AMBULATORY_CARE_PROVIDER_SITE_OTHER): Payer: BLUE CROSS/BLUE SHIELD | Admitting: General Practice

## 2018-06-23 ENCOUNTER — Ambulatory Visit: Payer: BLUE CROSS/BLUE SHIELD

## 2018-06-23 DIAGNOSIS — E538 Deficiency of other specified B group vitamins: Secondary | ICD-10-CM

## 2018-06-23 MED ORDER — CYANOCOBALAMIN 1000 MCG/ML IJ SOLN
1000.0000 ug | Freq: Once | INTRAMUSCULAR | Status: AC
  Administered 2018-06-23: 1000 ug via INTRAMUSCULAR

## 2018-06-23 NOTE — Progress Notes (Signed)
Julia Ford is a 57 y.o. female presents to the office today for B12 injections, per physician's orders. Original order: 05/16/18 cyanocobalamin (med),103100mcg/mL(dose),  IM (route) was administered RD (location) today. Patient tolerated injection. Patient due for follow up labs/provider appt: No.  Patient next injection due: 07/24/18, appt made No  Jessica L Brodmerkel
